# Patient Record
Sex: Female | Born: 1955 | Race: White | Hispanic: No | Marital: Single | Smoking: Never smoker
Health system: Southern US, Community
[De-identification: ages and names within clinical notes are randomized; demographics above are authoritative.]

## PROBLEM LIST (undated history)

## (undated) DIAGNOSIS — E063 Autoimmune thyroiditis: Secondary | ICD-10-CM

## (undated) HISTORY — PX: MAXILLARY LE FORTE III OSTEOTOMY: SHX2008

## (undated) HISTORY — PX: RHINOPLASTY: SUR1284

## (undated) DEATH — deceased

---

## 2016-05-21 ENCOUNTER — Emergency Department (HOSPITAL_COMMUNITY)
Admission: EM | Admit: 2016-05-21 | Discharge: 2016-05-21 | Disposition: A | Discharge status: Home / Self Care | Payer: No Typology Code available for payment source | Attending: Emergency Medicine | Admitting: Emergency Medicine

## 2016-05-21 ENCOUNTER — Encounter (HOSPITAL_COMMUNITY): Payer: Self-pay | Admitting: Emergency Medicine

## 2016-05-21 ENCOUNTER — Emergency Department (HOSPITAL_COMMUNITY): Payer: No Typology Code available for payment source

## 2016-05-21 DIAGNOSIS — Y999 Unspecified external cause status: Secondary | ICD-10-CM | POA: Diagnosis not present

## 2016-05-21 DIAGNOSIS — S199XXA Unspecified injury of neck, initial encounter: Secondary | ICD-10-CM | POA: Diagnosis present

## 2016-05-21 DIAGNOSIS — R51 Headache: Secondary | ICD-10-CM | POA: Diagnosis not present

## 2016-05-21 DIAGNOSIS — Y939 Activity, unspecified: Secondary | ICD-10-CM | POA: Diagnosis not present

## 2016-05-21 DIAGNOSIS — Y92410 Unspecified street and highway as the place of occurrence of the external cause: Secondary | ICD-10-CM | POA: Insufficient documentation

## 2016-05-21 HISTORY — DX: Autoimmune thyroiditis: E06.3

## 2016-05-21 MED ORDER — METHOCARBAMOL 500 MG PO TABS: 500.0000 mg | ORAL_TABLET | Freq: Two times a day (BID) | ORAL | 0 refills | Status: AC

## 2016-05-21 MED ORDER — NAPROXEN 500 MG PO TABS: 500.0000 mg | ORAL_TABLET | Freq: Two times a day (BID) | ORAL | 0 refills | Status: AC
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
## 2016-05-21 NOTE — Discharge Instructions (Signed)
Take your medications as prescribed as needed for pain relief. I also recommend applying ice to affected area for 15 minutes 3-4 times daily for additional pain relief. Please follow up with a primary care provider from the Resource Guide provided below in one week if your symptoms have not improved. Please return to the Emergency Department if symptoms worsen or new onset of fever, headache, lightheadedness, dizziness, neck stiffness, chest pain, difficulty breathing, abdominal pain, vomiting, numbness, tingling, weakness, syncope, seizure.

## 2016-05-21 NOTE — ED Notes (Signed)
Patient given coffee. 
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
## 2016-05-21 NOTE — ED Notes (Signed)
Patient transported to CT 

## 2017-10-20 ENCOUNTER — Other Ambulatory Visit: Payer: Self-pay

## 2017-10-20 ENCOUNTER — Other Ambulatory Visit: Payer: Self-pay | Admitting: Family

## 2017-10-20 ENCOUNTER — Emergency Department (HOSPITAL_BASED_OUTPATIENT_CLINIC_OR_DEPARTMENT_OTHER)
Admission: EM | Admit: 2017-10-20 | Discharge: 2017-10-20 | Disposition: A | Discharge status: Home / Self Care | Payer: Medicaid - Out of State | Attending: Family | Admitting: Family

## 2017-10-20 DIAGNOSIS — R079 Chest pain, unspecified: Secondary | ICD-10-CM | POA: Insufficient documentation

## 2017-10-20 DIAGNOSIS — R110 Nausea: Secondary | ICD-10-CM | POA: Insufficient documentation

## 2017-10-20 DIAGNOSIS — I495 Sick sinus syndrome: Secondary | ICD-10-CM

## 2017-10-20 DIAGNOSIS — R001 Bradycardia, unspecified: Secondary | ICD-10-CM | POA: Insufficient documentation

## 2017-10-20 DIAGNOSIS — R0789 Other chest pain: Secondary | ICD-10-CM | POA: Insufficient documentation

## 2017-10-20 LAB — COMPREHENSIVE METABOLIC PANEL
ALT (GPT): 9 U/L (ref 7–33)
AST (GOT): 19 U/L (ref 9–38)
Albumin: 4.4 g/dL (ref 3.5–5.2)
Alkaline Phosphatase (Total): 53 U/L (ref 31–132)
Anion Gap: 8 (ref 4–12)
Bilirubin (Total): 0.7 mg/dL (ref 0.2–1.3)
Calcium: 9.4 mg/dL (ref 8.9–10.2)
Carbon Dioxide, Total: 28 meq/L (ref 22–32)
Chloride: 103 meq/L (ref 98–108)
Creatinine: 0.62 mg/dL (ref 0.38–1.02)
GFR, Calc, African American: >60 mL/min/{1.73_m2} (ref >59)
GFR, Calc, European American: >60 mL/min/{1.73_m2} (ref >59)
Glucose: 86 mg/dL (ref 62–125)
Potassium: 4.3 meq/L (ref 3.6–5.2)
Protein (Total): 6.6 g/dL (ref 6.0–8.2)
Sodium: 139 meq/L (ref 135–145)
Urea Nitrogen: 9 mg/dL (ref 8–21)

## 2017-10-20 LAB — CBC, DIFF
% Basophils: 1 %
% Eosinophils: 1 %
% Immature Granulocytes: 1 %
% Lymphocytes: 25 %
% Monocytes: 8 %
% Neutrophils: 64 %
% Nucleated RBC: 0 %
Absolute Eosinophil Count: 0.05 10*3/uL (ref 0.00–0.50)
Absolute Lymphocyte Count: 1.07 10*3/uL (ref 1.00–4.80)
Basophils: 0.05 10*3/uL (ref 0.00–0.20)
Hematocrit: 42 % (ref 36–45)
Hemoglobin: 14.4 g/dL (ref 11.5–15.5)
Immature Granulocytes: 0.02 10*3/uL (ref 0.00–0.05)
MCH: 30.4 pg (ref 27.3–33.6)
MCHC: 34 g/dL (ref 32.2–36.5)
MCV: 89 fL (ref 81–98)
Monocytes: 0.33 10*3/uL (ref 0.00–0.80)
Neutrophils: 2.71 10*3/uL (ref 1.80–7.00)
Nucleated RBC: 0 10*3/uL
Platelet Count: 245 10*3/uL (ref 150–400)
RBC: 4.74 10*6/uL (ref 3.80–5.00)
RDW-CV: 11.9 % (ref 11.6–14.4)
WBC: 4.23 10*3/uL — ABNORMAL LOW (ref 4.30–10.00)

## 2017-10-20 LAB — URINALYSIS WITH REFLEX CULTURE
Bacteria, URN: NONE SEEN
Bilirubin (Qual), URN: NEGATIVE
Epith Cells_Renal/Trans,URN: NEGATIVE /HPF
Epith Cells_Squamous, URN: NEGATIVE /LPF
Glucose Qual, URN: NEGATIVE mg/dL
Ketones, URN: NEGATIVE mg/dL
Leukocyte Esterase, URN: POSITIVE — AB
Nitrite, URN: NEGATIVE
Occult Blood, URN: NEGATIVE
Protein (Alb Semiquant), URN: NEGATIVE mg/dL
RBC, URN: NEGATIVE /HPF
Specific Gravity, URN: 1 g/mL — ABNORMAL LOW (ref 1.006–1.027)
WBC, URN: NEGATIVE /HPF
pH, URN: 6.5 (ref 5.0–8.0)

## 2017-10-20 LAB — REFLEX CULTURE FOR UA

## 2017-10-20 LAB — LIPASE: Lipase: 41 U/L (ref <70)

## 2017-10-20 LAB — THYROID STIMULATING HORMONE: Thyroid Stimulating Hormone: 1.999 u[IU]/mL (ref 0.400–5.000)

## 2017-10-20 LAB — TROPONIN_I
Troponin_I Interpretation: NORMAL
Troponin_I: <0.03 ng/mL (ref <0.04)

## 2017-10-21 LAB — EKG 12 LEAD
Atrial Rate: 58 {beats}/min
P Axis: 35 degrees
P-R Interval: 164 ms
Q-T Interval: 400 ms
QRS Duration: 96 ms
QTC Calculation: 392 ms
R Axis: 86 degrees
T Axis: 49 degrees
Ventricular Rate: 58 {beats}/min

## 2017-10-22 LAB — URINE C/S: Culture: >100000

## 2017-12-17 ENCOUNTER — Emergency Department (HOSPITAL_BASED_OUTPATIENT_CLINIC_OR_DEPARTMENT_OTHER): Admission: EM | Admit: 2017-12-17 | Discharge: 2017-12-18 | Payer: No Typology Code available for payment source
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
## 2019-04-09 ENCOUNTER — Inpatient Hospital Stay: Payer: Self-pay

## 2021-03-08 ENCOUNTER — Ambulatory Visit (HOSPITAL_COMMUNITY): Payer: Medicare Other

## 2021-03-08 ENCOUNTER — Ambulatory Visit: Payer: Medicare Other | Attending: Internal Medicine

## 2021-03-08 DIAGNOSIS — Z20822 Contact with and (suspected) exposure to covid-19: Secondary | ICD-10-CM

## 2021-03-08 LAB — COVID-19 CORONAVIRUS QUALITATIVE PCR: COVID-19 Coronavirus Qual PCR Result: NOT DETECTED

## 2021-03-08 NOTE — Progress Notes (Signed)
Patient was seen today at the [facility] COVID-19 testing site where a dual collection sample was taken from the anterior nares. The specimen was sent to the [facility] lab for COVID-19 testing. Results will be available within 48 hours. Patient received informational instructions on self-care. The specimen was collected by designated RN/LPN/MA per standing order.

## 2021-06-30 LAB — OTHER LAB ORDER
Hemoglobin A1C: 4.9 % (ref 4.8–5.6)
Mean Blood Glucose Estimate: 94 mg/dL

## 2021-09-01 LAB — OTHER LAB ORDER
Hemoglobin A1C: 4.9 % (ref 4.8–5.6)
Mean Blood Glucose Estimate: 94 mg/dL

## 2021-12-06 LAB — OTHER LAB ORDER
Hemoglobin A1C: 5.2 % (ref 4.8–5.6)
Mean Blood Glucose Estimate: 103 mg/dL

## 2022-01-14 ENCOUNTER — Emergency Department
Admit: 2022-01-14 | Discharge: 2022-01-14 | Discharge status: Against Medical Advice | Payer: Medicare HMO | Attending: Emergency Medicine | Admitting: Emergency Medicine

## 2022-01-14 ENCOUNTER — Other Ambulatory Visit: Payer: Self-pay

## 2022-01-14 DIAGNOSIS — S61411A Laceration without foreign body of right hand, initial encounter: Secondary | ICD-10-CM | POA: Insufficient documentation

## 2022-01-14 DIAGNOSIS — Y93G1 Activity, food preparation and clean up: Secondary | ICD-10-CM | POA: Insufficient documentation

## 2022-01-14 DIAGNOSIS — W260XXA Contact with knife, initial encounter: Secondary | ICD-10-CM | POA: Insufficient documentation

## 2022-01-14 DIAGNOSIS — Y92000 Kitchen of unspecified non-institutional (private) residence as  the place of occurrence of the external cause: Secondary | ICD-10-CM | POA: Insufficient documentation

## 2022-01-14 NOTE — ED Provider Notes (Signed)
CHIEF COMPLAINT   Chief Complaint   Patient presents with    Laceration     R hand            HISTORY OF PRESENT ILLNESS AND REVIEW OF SYSTEMS        Renee Erickson is a 66 y/o female with hypothyroidism who presents for a right hand laceration. About 30 min prior to arrival, patient took a clean kitchen knife out of her kitchen cupboard, and accidentally cut the palm of her right hand while trying to open a food package with the knife. She had some mild bleeding which has since stopped. Says she is able to use her fingers and hand without difficulty and with only mild pain around the laceration. No numbness, weakness, or tingling. No foreign body or breaking of the knife.    Review of systems as noted above in HPI otherwise other systems reviewed and found to be negative or noncontributory.            PAST MEDICAL AND SURGICAL HISTORY   No past medical history on file.    No past surgical history on file.       MEDICATIONS AND ALLERGIES     OUTPATIENT MEDICATIONS:   No current outpatient medications    ALLERGIES:   Aspirin, Nsaids, Red dye, Sulfa antibiotics, Cephalexin, Penicillins, and Prednisone            SOCIAL HISTORY AND FAMILY HISTORY                  PHYSICAL EXAM   ED VITALS:  Vitals (Arrival)      T: 36 C (01/14/22 1324)  BP: 140/84 (01/14/22 1324)  HR: 89 (01/14/22 1324)  RR: 18 (01/14/22 1324)  SpO2: 98 % (01/14/22 1324)     Vitals (Most recent in last 24 hrs)   T: 36 C (01/14/22 1324)  BP: 140/84 (01/14/22 1324)  HR: 89 (01/14/22 1324)  RR: 18 (01/14/22 1324)  SpO2: 98 % (01/14/22 1324)    T range: Temp  Min: 36 C  Max: 36 C  (no weight taken for this visit)     (no height taken for this visit)     There is no height or weight on file to calculate BMI.       Physical Exam  Vitals and nursing note reviewed.   Constitutional:       General: She is not in acute distress.     Appearance: Normal appearance. She is not ill-appearing.   HENT:      Head: Normocephalic and atraumatic.      Mouth/Throat:       Mouth: Mucous membranes are moist.      Pharynx: Oropharynx is clear.   Eyes:      Extraocular Movements: Extraocular movements intact.   Cardiovascular:      Pulses: Normal pulses.   Pulmonary:      Effort: Pulmonary effort is normal.   Abdominal:      General: Abdomen is flat.   Musculoskeletal:         General: No swelling or deformity. Normal range of motion.      Comments: Approximately 2cm superficial laceration to center of right palm. Not actively bleeding.   Skin:     General: Skin is warm and dry.      Capillary Refill: Capillary refill takes less than 2 seconds.      Findings: Lesion present.   Neurological:  General: No focal deficit present.      Mental Status: She is alert and oriented to person, place, and time.      Sensory: No sensory deficit.      Motor: No weakness.             LABORATORY:   Labs Reviewed - No data to display      IMAGING:     ED Wet Read -   No orders to display       Radiology Final Result -   No image results found.              EKG DOCUMENTATION      No EKG was performed           SUICIDE RISK EVALUATION             SEPSIS               ED COURSE/MEDICAL DECISION MAKING      Kearstin Learn is a 66 y/o female with hypothyroidism who presents for a right hand laceration from a kitchen knife earlier today. Patient with small 1-2cm laceration that would benefit from a single stitch. No concern for injury to nerves or major blood vessels. However, patient prefers to not have stitches put in at this time. Wound will still heal normally without stitches as long as area is clean, although it may take longer. Patient otherwise neurovascularly intact with recent tetanus shot, and no exam findings concerning for infection or signs of embedded foreign body. Patient understands that wound may take longer to heal without stitch and was educated on wound care. No further concerns and return precautions given.                                             Medications Given in the ED:    Medications - No data to display           CLINICAL IMPRESSION/DISPOSITION     Clinical Impressions:   [S61.411A] Laceration of right hand without foreign body, initial encounter        No follow-up provider specified.    Patient was given scripts for the following medications.  There are no discharge medications for this patient.           Disposition: Discharge        CRITICAL CARE/ADDITIONAL INFORMATION REVIEWED ATTENDING Lendon Collar, MD  Resident  01/14/22 [phone]

## 2022-01-14 NOTE — ED Notes (Addendum)
Due to the emergent care needs of this patient, all times are estimated.    Assumed care of pt, Renee Erickson, 66 year old, female from triage.  Small lac to right mid palm.  Bleeding stopped at this time.  Full ROM  No numbness or tingling  Report tdap utd  Unable to find records of such in EPIC.   Pt appears   Vital signs were reviewed and any abnormalities had appropriate intervention.   Pt appears anxious and refuses to have many intervention done.       Gerre Couch, RN, [facility]   Emergency Department   [facility]       Patrina Levering, RN  01/14/22 1358       Patrina Levering, RN  01/14/22 1402

## 2022-01-14 NOTE — ED Triage Notes (Signed)
Pt presents with laceration to the R hand in the middle of her palm. Currently oozing at triage.

## 2022-01-14 NOTE — Discharge Instructions (Addendum)
You were seen in the emergency department due to a laceration on your right hand. Fortunately you did not need any further imaging or treatment, and the laceration should heal on its own. Please keep the area clean and replace the gauze/bandage regularly. If the area becomes red, swollen, or more painful and you are concerned about potential infection, please return for further evaluation.
